# Patient Record
Sex: Male | Born: 1995 | Race: White | Hispanic: No | Marital: Single | Smoking: Never smoker
Health system: Southern US, Community
[De-identification: ages and names within clinical notes are randomized; demographics above are authoritative.]

---
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
## 2016-11-17 DIAGNOSIS — M62838 Other muscle spasm: Secondary | ICD-10-CM | POA: Diagnosis not present

## 2017-06-26 ENCOUNTER — Ambulatory Visit: Payer: Self-pay | Admitting: Adult Health

## 2017-06-28 ENCOUNTER — Encounter: Payer: Self-pay | Admitting: Adult Health

## 2018-05-16 DIAGNOSIS — H40033 Anatomical narrow angle, bilateral: Secondary | ICD-10-CM | POA: Diagnosis not present

## 2018-05-16 DIAGNOSIS — H04123 Dry eye syndrome of bilateral lacrimal glands: Secondary | ICD-10-CM | POA: Diagnosis not present
